# Patient Record
Sex: Male | Born: 1955 | Race: White | Hispanic: No | Marital: Married | Smoking: Current every day smoker
Health system: Southern US, Community
[De-identification: ages and names within clinical notes are randomized; demographics above are authoritative.]

## PROBLEM LIST (undated history)

## (undated) DIAGNOSIS — K7460 Unspecified cirrhosis of liver: Secondary | ICD-10-CM

## (undated) DIAGNOSIS — E119 Type 2 diabetes mellitus without complications: Secondary | ICD-10-CM

## (undated) DIAGNOSIS — M1990 Unspecified osteoarthritis, unspecified site: Secondary | ICD-10-CM

## (undated) HISTORY — DX: Type 2 diabetes mellitus without complications: E11.9

## (undated) HISTORY — DX: Unspecified cirrhosis of liver: K74.60

---

## 2012-09-06 ENCOUNTER — Ambulatory Visit (INDEPENDENT_AMBULATORY_CARE_PROVIDER_SITE_OTHER): Payer: Self-pay | Admitting: Internal Medicine

## 2012-09-06 ENCOUNTER — Other Ambulatory Visit (INDEPENDENT_AMBULATORY_CARE_PROVIDER_SITE_OTHER): Payer: Self-pay | Admitting: *Deleted

## 2012-09-06 ENCOUNTER — Encounter (INDEPENDENT_AMBULATORY_CARE_PROVIDER_SITE_OTHER): Payer: Self-pay | Admitting: *Deleted

## 2012-09-06 ENCOUNTER — Ambulatory Visit (INDEPENDENT_AMBULATORY_CARE_PROVIDER_SITE_OTHER): Payer: 59 | Admitting: Internal Medicine

## 2012-09-06 ENCOUNTER — Encounter (INDEPENDENT_AMBULATORY_CARE_PROVIDER_SITE_OTHER): Payer: Self-pay | Admitting: Internal Medicine

## 2012-09-06 VITALS — BP 126/58 | HR 72 | Temp 97.7°F | Ht 71.0 in | Wt 318.7 lb

## 2012-09-06 DIAGNOSIS — I8500 Esophageal varices without bleeding: Secondary | ICD-10-CM | POA: Insufficient documentation

## 2012-09-06 DIAGNOSIS — K7460 Unspecified cirrhosis of liver: Secondary | ICD-10-CM

## 2012-09-06 DIAGNOSIS — E119 Type 2 diabetes mellitus without complications: Secondary | ICD-10-CM | POA: Insufficient documentation

## 2012-09-06 LAB — PROTIME-INR
INR: 1.25 (ref <1.50)
Prothrombin Time: 15.6 seconds — ABNORMAL HIGH (ref 11.6–15.2)

## 2012-09-06 MED ORDER — NADOLOL 20 MG PO TABS: 20.0000 mg | ORAL_TABLET | Freq: Every day | ORAL | Status: DC

## 2012-09-06 NOTE — Patient Instructions (Signed)
EGD with esophageal banding  

## 2012-09-06 NOTE — Progress Notes (Signed)
Subjective:     Patient ID: David Solomon, male   DOB: 1956/05/13, 57 y.o.   MRN: [id number]  HPIReferred to our office by Adrienne Mocha MD, [facility] in [place] for an esophageal variceal  banding.  Underwent an US abdomen 07/24/2012 which showed portal hypertension and cirrhosis. [place] did not show any evidence of hepatocellular cancer. Small to moderate scattered ascites. Gallbladder wall thickening, likely related to chronic liver disease or ortal hypertension given ascites and splenomegaly. No evidence for large shadowing stones or hydronephrosis.  Spleen is enlarged measuring 20 cm.   Diagnosed in February of this year with cirrhosis. Hep C Virus Ab. 0.2 negative HepBsAG screen negative  Ammonia 59   He noticed the swelling February 1 of this year. He had swelling to his scrotum, abdomen, and lower extremities. He denies prior hx of edema.  Placed on Lasix 80mg  and Aldactone. His weight on his 1st visit in February (10th)  was 380. He is down to 318 now. He says his weight is at base line now.  He tells me he feels better now. He is voiding okay.  Appetite is good. Liquids are restricted to a liter a day. Low sodium diet. He usually has a BM daily  Total protein 7.0 albumin 3.1 ALP 96, ALT 40, AST 37, Total bili 1.2, Direct bili 0.3, Indirect bili 0.9 08/03/2012 NA 140, K 3.7, Chloride 100, Glucose 199, BUN 22, Creatinine 0.6,  H and H 39 and 13.3.  BNP 56, Albumin 2.8,  HA1C: 8.5  Hx of morbid obesity and DM2.    Review of Systems see hpi Current Outpatient Prescriptions  Medication Sig Dispense Refill  . furosemide (LASIX) 40 MG tablet Take 40 mg by mouth daily.      Marland Kitchen glipiZIDE (GLUCOTROL) 5 MG tablet Take 5 mg by mouth 2 (two) times daily before a meal.      . metFORMIN (GLUCOPHAGE) 1000 MG tablet Take 1,000 mg by mouth 2 (two) times daily with a meal.      . spironolactone (ALDACTONE) 25 MG tablet Take 25 mg by mouth daily.       No current  facility-administered medications for this visit.   Past Medical History  Diagnosis Date  . Cirrhosis of liver not due to alcohol   . Diabetes     type 2 x 15 yrs.   History reviewed. No pertinent past surgical history. No Known Allergies       Objective:   Physical Exam  Filed Vitals:   09/06/12 1538  BP: 126/58  Pulse: 72  Temp: 97.7 F (36.5 C)  Height: 5\' 11"  (1.803 m)  Weight: 318 lb 11.2 oz (144.561 kg)   Alert and oriented. Skin warm and dry. Oral mucosa is moist.   . Sclera anicteric, conjunctivae is pink. Thyroid not enlarged. No cervical lymphadenopathy. Lungs clear. Heart regular rate and rhythm.  Abdomen is soft. Bowel sounds are positive. Unable to palpate liver.  No abdominal masses felt.  Morbidly obese. No tenderness.   . Morbidly obese  No asterixis    Assessment:    Esophageal varices per EGD by Dr. Kirby Crigler. I discussed this case with Dr. Karilyn Cota and Dr. Karilyn Cota also in exam room.     Plan:    EGD with esophageal banding. CBC, PT/INR before procedure.

## 2012-09-07 ENCOUNTER — Encounter (HOSPITAL_COMMUNITY): Payer: Self-pay | Admitting: Pharmacy Technician

## 2012-09-07 LAB — CBC WITH DIFFERENTIAL/PLATELET
Basophils Relative: 1 % (ref 0–1)
HCT: 40.1 % (ref 39.0–52.0)
Hemoglobin: 13.1 g/dL (ref 13.0–17.0)
Lymphocytes Relative: 21 % (ref 12–46)
Lymphs Abs: 0.8 10*3/uL (ref 0.7–4.0)
MCHC: 32.7 g/dL (ref 30.0–36.0)
Monocytes Absolute: 0.5 10*3/uL (ref 0.1–1.0)
Monocytes Relative: 14 % — ABNORMAL HIGH (ref 3–12)
Neutro Abs: 2.3 10*3/uL (ref 1.7–7.7)
Neutrophils Relative %: 60 % (ref 43–77)
RBC: 4.78 MIL/uL (ref 4.22–5.81)

## 2012-09-08 ENCOUNTER — Encounter (HOSPITAL_COMMUNITY)
Admission: RE | Disposition: A | Discharge status: Home / Self Care | Payer: Self-pay | Source: Ambulatory Visit | Attending: Internal Medicine

## 2012-09-08 ENCOUNTER — Encounter (HOSPITAL_COMMUNITY): Payer: Self-pay | Admitting: *Deleted

## 2012-09-08 ENCOUNTER — Ambulatory Visit (HOSPITAL_COMMUNITY)
Admission: RE | Admit: 2012-09-08 | Discharge: 2012-09-08 | Disposition: A | Discharge status: Home / Self Care | Payer: 59 | Source: Ambulatory Visit | Attending: Internal Medicine | Admitting: Internal Medicine

## 2012-09-08 DIAGNOSIS — I8500 Esophageal varices without bleeding: Secondary | ICD-10-CM

## 2012-09-08 DIAGNOSIS — I8510 Secondary esophageal varices without bleeding: Secondary | ICD-10-CM | POA: Insufficient documentation

## 2012-09-08 DIAGNOSIS — Z01812 Encounter for preprocedural laboratory examination: Secondary | ICD-10-CM | POA: Insufficient documentation

## 2012-09-08 DIAGNOSIS — E119 Type 2 diabetes mellitus without complications: Secondary | ICD-10-CM | POA: Insufficient documentation

## 2012-09-08 DIAGNOSIS — K7460 Unspecified cirrhosis of liver: Secondary | ICD-10-CM | POA: Insufficient documentation

## 2012-09-08 HISTORY — PX: ESOPHAGOGASTRODUODENOSCOPY: SHX5428

## 2012-09-08 HISTORY — PX: ESOPHAGEAL BANDING: SHX5518

## 2012-09-08 HISTORY — DX: Unspecified osteoarthritis, unspecified site: M19.90

## 2012-09-08 SURGERY — EGD (ESOPHAGOGASTRODUODENOSCOPY)
Anesthesia: Moderate Sedation

## 2012-09-08 MED ORDER — MEPERIDINE HCL 25 MG/ML IJ SOLN
INTRAMUSCULAR | Status: DC | PRN
  Administered 2012-09-08 (×2): 25 mg via INTRAVENOUS

## 2012-09-08 MED ORDER — PANTOPRAZOLE SODIUM 40 MG PO TBEC: 40.0000 mg | DELAYED_RELEASE_TABLET | Freq: Every day | ORAL | Status: DC

## 2012-09-08 MED ORDER — MIDAZOLAM HCL 5 MG/5ML IJ SOLN
INTRAMUSCULAR | Status: DC | PRN
  Administered 2012-09-08 (×4): 2 mg via INTRAVENOUS

## 2012-09-08 MED ORDER — STERILE WATER FOR IRRIGATION IR SOLN
Status: DC | PRN
  Administered 2012-09-08: 16:00:00

## 2012-09-08 MED ORDER — MEPERIDINE HCL 50 MG/ML IJ SOLN
INTRAMUSCULAR | Status: AC
  Filled 2012-09-08: qty 2

## 2012-09-08 MED ORDER — MIDAZOLAM HCL 5 MG/5ML IJ SOLN
INTRAMUSCULAR | Status: AC
  Filled 2012-09-08: qty 10

## 2012-09-08 MED ORDER — BUTAMBEN-TETRACAINE-BENZOCAINE 2-2-14 % EX AERO
INHALATION_SPRAY | CUTANEOUS | Status: DC | PRN
  Administered 2012-09-08: 2 via TOPICAL

## 2012-09-08 MED ORDER — SUCRALFATE 1 G PO TABS
1.0000 g | ORAL_TABLET | Freq: Four times a day (QID) | ORAL | Status: DC
Start: 2012-09-08 — End: 2021-09-21

## 2012-09-08 MED ORDER — SODIUM CHLORIDE 0.9 % IV SOLN
INTRAVENOUS | Status: DC
  Administered 2012-09-08: 1000 mL via INTRAVENOUS

## 2012-09-08 NOTE — H&P (Signed)
David Solomon is an 57 y.o. male.   Chief Complaint: Patient sent for EGD and esophageal variceal banding. 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 He was evaluated by Dr. Despina Hidden and underwent EGD and  found to have large esophageal varices. He is therefore sent over for esophageal variceal banding for primary prophylaxis. Patient was begun on Medrol 2 days ago and is tolerating it well.  Past Medical History  Diagnosis Date  . Cirrhosis of liver not due to alcohol   . Diabetes     type 2 x 15 yrs.  . Arthritis     History reviewed. No pertinent past surgical history.  History reviewed. No pertinent family history. Social History:  reports that he has been smoking Cigarettes.  He has been smoking about 0.00 packs per day. He does not have any smokeless tobacco history on file. He reports that he does not drink alcohol or use illicit drugs.  Allergies: No Known Allergies  Medications Prior to Admission  Medication Sig Dispense Refill  . furosemide (LASIX) 40 MG tablet Take 40 mg by mouth daily.      Marland Kitchen glipiZIDE (GLUCOTROL) 5 MG tablet Take 5 mg by mouth 2 (two) times daily before a meal.      . metFORMIN (GLUCOPHAGE) 1000 MG tablet Take 1,000 mg by mouth 2 (two) times daily with a meal.      . nadolol (CORGARD) 20 MG tablet Take 1 tablet (20 mg total) by mouth daily.  30 tablet  2  . spironolactone (ALDACTONE) 25 MG tablet Take 25 mg by mouth daily.        Results for orders placed during the hospital encounter of 09/08/12 (from the past 48 hour(s))  GLUCOSE, CAPILLARY     Status: Abnormal   Collection Time    09/08/12  3:18 PM      Result Value Range   Glucose-Capillary 150 (*) 70 - 99 mg/dL   No results found.  ROS  Blood pressure 114/81, pulse 62, temperature 97.4 F (36.3 C), temperature source Oral, resp. rate 18, height 5\' 11"  (1.803 m), weight 318 lb (144.244 kg), SpO2  99.00%. Physical Exam  Constitutional:  Pleasant well developed obese Caucasian male in NAD  HENT:  Mouth/Throat: Oropharynx is clear and moist.  Eyes: Conjunctivae are normal. No scleral icterus.  Neck: No thyromegaly present.  Cardiovascular: Normal rate, regular rhythm and normal heart sounds.   No murmur heard. Respiratory: Effort normal and breath sounds normal.  GI:  Abdomen is obese but soft and nontender without hepatosplenomegaly. It is difficult to palpate these organs given body habitus.  Musculoskeletal: Edema: 1+ pitting edema both legs.  Lymphadenopathy:    He has no cervical adenopathy.  Neurological: He is alert.  Skin: Skin is warm and dry.     Assessment/Plan Cirrhosis secondary to NAFLD complicated by large esophageal varices. EGD with esophageal variceal banding for primary prophylaxis.  David Solomon U 09/08/2012, 3:43 PM

## 2012-09-08 NOTE — Op Note (Signed)
EGD WITH ESOPHAGEAL VARICEAL BANDING PROCEDURE REPORT  PATIENT:  David Solomon  MR#:  [id number] Birthdate:  Sep 25, 1955, 57 y.o., male Endoscopist:  Dr. Malissa Hippo, MD Referred By:  Dr. Mike Gip, MD Procedure Date: 09/08/2012  Procedure:   EGD with esophageal variceal banding.  Indications:  Patient is 57 year old Caucasian male with cirrhosis secondary to NAFLD who was found to have large esophageal varices by his primary gastroenterologist Dr.Shiflet and sent over for esophageal variceal banding.             Informed Consent:  The risks, benefits, alternatives & imponderables which include, but are not limited to, bleeding, infection, perforation, drug reaction and potential missed lesion have been reviewed.  The potential for biopsy, lesion removal, esophageal dilation, etc. have also been discussed.  Questions have been answered.  All parties agreeable.  Please see history & physical in medical record for more information.  Medications:  Demerol 50 mg IV Versed 8 mg IV Cetacaine spray topically for oropharyngeal anesthesia  Description of procedure:  The endoscope was introduced through the mouth and advanced to the second portion of the duodenum without difficulty or limitations. The mucosal surfaces were surveyed very carefully during advancement of the scope and upon withdrawal.  Findings:  Esophagus:  Mucosa of the proximal esophagus normal. 2 columns of varices is noted midesophagus in 4 columns distally. two were large and grade 4 and the two other werwe smaller. GEJ:  41 cm Stomach:  Stomach was empty and distended very well insufflation. Folds in the proximal stomach were normal. Examination mucosa revealed snakeskin and normocytic pattern with a few punctate erythematous areas at antrum. Pyloric channel was patent. Annularis fundus and cardia were examined by retroflexing the scope and no abnormalities noted. Duodenum:  Normal bulbar and post bulbar  mucosa.  Therapeutic/Diagnostic Maneuvers Performed:   And this goes withdrawn and multiple banding device was loaded onto the scope which was then reintroduced into distal esophagus. All 4 columns were banded in 2 bands are applied to the way except 6 o'clock. Endoscope withdrawn. Patient tolerated the procedure without  Complications:  None  Impression: 4 columns of esophageal varices. Two of these columns of grade 4 and others were smaller. All 4 columns were banded. Portal gastropathy.  Recommendations:  Pantoprazole 40 mg by mouth every morning. Sucralfate 1 g a.c. and each bedtime for 2 weeks. Full liquids for 24 hours and then usual diet. Will bring him back for repeat banding session in 5-6 weeks.  Saidy Ormand U  09/08/2012  4:17 PM  CC: Dr. Aniceto Boss., MD & Dr. Bonnetta Barry ref. provider found CC vDr. Mike Gip MD

## 2012-09-12 ENCOUNTER — Encounter (HOSPITAL_COMMUNITY): Payer: Self-pay | Admitting: Internal Medicine

## 2012-09-18 ENCOUNTER — Encounter (INDEPENDENT_AMBULATORY_CARE_PROVIDER_SITE_OTHER): Payer: Self-pay

## 2012-10-09 ENCOUNTER — Encounter (INDEPENDENT_AMBULATORY_CARE_PROVIDER_SITE_OTHER): Payer: Self-pay | Admitting: *Deleted

## 2012-12-08 DIAGNOSIS — K760 Fatty (change of) liver, not elsewhere classified: Secondary | ICD-10-CM | POA: Insufficient documentation

## 2015-02-20 DIAGNOSIS — G4733 Obstructive sleep apnea (adult) (pediatric): Secondary | ICD-10-CM | POA: Insufficient documentation

## 2015-04-11 DIAGNOSIS — K7460 Unspecified cirrhosis of liver: Secondary | ICD-10-CM | POA: Insufficient documentation

## 2015-07-23 ENCOUNTER — Encounter (INDEPENDENT_AMBULATORY_CARE_PROVIDER_SITE_OTHER): Payer: Medicare Other | Admitting: Ophthalmology

## 2015-07-23 DIAGNOSIS — E11311 Type 2 diabetes mellitus with unspecified diabetic retinopathy with macular edema: Secondary | ICD-10-CM

## 2015-07-23 DIAGNOSIS — H2513 Age-related nuclear cataract, bilateral: Secondary | ICD-10-CM | POA: Diagnosis not present

## 2015-07-23 DIAGNOSIS — H43813 Vitreous degeneration, bilateral: Secondary | ICD-10-CM | POA: Diagnosis not present

## 2015-07-23 DIAGNOSIS — E113313 Type 2 diabetes mellitus with moderate nonproliferative diabetic retinopathy with macular edema, bilateral: Secondary | ICD-10-CM | POA: Diagnosis not present

## 2015-08-13 ENCOUNTER — Other Ambulatory Visit (INDEPENDENT_AMBULATORY_CARE_PROVIDER_SITE_OTHER): Payer: Medicare Other | Admitting: Ophthalmology

## 2015-08-13 DIAGNOSIS — E113312 Type 2 diabetes mellitus with moderate nonproliferative diabetic retinopathy with macular edema, left eye: Secondary | ICD-10-CM

## 2015-08-13 DIAGNOSIS — E11311 Type 2 diabetes mellitus with unspecified diabetic retinopathy with macular edema: Secondary | ICD-10-CM

## 2015-08-20 ENCOUNTER — Encounter (INDEPENDENT_AMBULATORY_CARE_PROVIDER_SITE_OTHER): Payer: Medicare Other | Admitting: Ophthalmology

## 2015-08-20 DIAGNOSIS — E11311 Type 2 diabetes mellitus with unspecified diabetic retinopathy with macular edema: Secondary | ICD-10-CM | POA: Diagnosis not present

## 2015-08-20 DIAGNOSIS — E113313 Type 2 diabetes mellitus with moderate nonproliferative diabetic retinopathy with macular edema, bilateral: Secondary | ICD-10-CM

## 2015-08-20 DIAGNOSIS — H43813 Vitreous degeneration, bilateral: Secondary | ICD-10-CM

## 2015-08-20 DIAGNOSIS — H2513 Age-related nuclear cataract, bilateral: Secondary | ICD-10-CM

## 2015-09-17 ENCOUNTER — Encounter (INDEPENDENT_AMBULATORY_CARE_PROVIDER_SITE_OTHER): Payer: Medicare Other | Admitting: Ophthalmology

## 2015-09-17 DIAGNOSIS — E11311 Type 2 diabetes mellitus with unspecified diabetic retinopathy with macular edema: Secondary | ICD-10-CM

## 2015-09-17 DIAGNOSIS — E113313 Type 2 diabetes mellitus with moderate nonproliferative diabetic retinopathy with macular edema, bilateral: Secondary | ICD-10-CM

## 2015-09-17 DIAGNOSIS — H43813 Vitreous degeneration, bilateral: Secondary | ICD-10-CM | POA: Diagnosis not present

## 2015-09-17 DIAGNOSIS — H2513 Age-related nuclear cataract, bilateral: Secondary | ICD-10-CM

## 2015-10-22 ENCOUNTER — Encounter (INDEPENDENT_AMBULATORY_CARE_PROVIDER_SITE_OTHER): Payer: Medicare Other | Admitting: Ophthalmology

## 2015-10-22 DIAGNOSIS — H43813 Vitreous degeneration, bilateral: Secondary | ICD-10-CM | POA: Diagnosis not present

## 2015-10-22 DIAGNOSIS — E11311 Type 2 diabetes mellitus with unspecified diabetic retinopathy with macular edema: Secondary | ICD-10-CM | POA: Diagnosis not present

## 2015-10-22 DIAGNOSIS — E113313 Type 2 diabetes mellitus with moderate nonproliferative diabetic retinopathy with macular edema, bilateral: Secondary | ICD-10-CM

## 2015-10-22 DIAGNOSIS — H2513 Age-related nuclear cataract, bilateral: Secondary | ICD-10-CM

## 2015-11-05 ENCOUNTER — Other Ambulatory Visit (INDEPENDENT_AMBULATORY_CARE_PROVIDER_SITE_OTHER): Payer: Medicare Other | Admitting: Ophthalmology

## 2015-11-05 DIAGNOSIS — E11311 Type 2 diabetes mellitus with unspecified diabetic retinopathy with macular edema: Secondary | ICD-10-CM | POA: Diagnosis not present

## 2015-11-05 DIAGNOSIS — E113311 Type 2 diabetes mellitus with moderate nonproliferative diabetic retinopathy with macular edema, right eye: Secondary | ICD-10-CM

## 2015-11-26 ENCOUNTER — Encounter (INDEPENDENT_AMBULATORY_CARE_PROVIDER_SITE_OTHER): Payer: Medicare Other | Admitting: Ophthalmology

## 2015-11-26 DIAGNOSIS — H43813 Vitreous degeneration, bilateral: Secondary | ICD-10-CM

## 2015-11-26 DIAGNOSIS — E113313 Type 2 diabetes mellitus with moderate nonproliferative diabetic retinopathy with macular edema, bilateral: Secondary | ICD-10-CM

## 2015-11-26 DIAGNOSIS — E11311 Type 2 diabetes mellitus with unspecified diabetic retinopathy with macular edema: Secondary | ICD-10-CM | POA: Diagnosis not present

## 2015-11-26 DIAGNOSIS — H2513 Age-related nuclear cataract, bilateral: Secondary | ICD-10-CM | POA: Diagnosis not present

## 2015-12-31 ENCOUNTER — Encounter (INDEPENDENT_AMBULATORY_CARE_PROVIDER_SITE_OTHER): Payer: Medicare Other | Admitting: Ophthalmology

## 2016-01-07 ENCOUNTER — Encounter (INDEPENDENT_AMBULATORY_CARE_PROVIDER_SITE_OTHER): Payer: Medicare Other | Admitting: Ophthalmology

## 2016-01-07 DIAGNOSIS — H43813 Vitreous degeneration, bilateral: Secondary | ICD-10-CM

## 2016-01-07 DIAGNOSIS — E11311 Type 2 diabetes mellitus with unspecified diabetic retinopathy with macular edema: Secondary | ICD-10-CM

## 2016-01-07 DIAGNOSIS — E113313 Type 2 diabetes mellitus with moderate nonproliferative diabetic retinopathy with macular edema, bilateral: Secondary | ICD-10-CM | POA: Diagnosis not present

## 2016-02-25 ENCOUNTER — Encounter (INDEPENDENT_AMBULATORY_CARE_PROVIDER_SITE_OTHER): Payer: Medicare Other | Admitting: Ophthalmology

## 2016-02-25 DIAGNOSIS — E11311 Type 2 diabetes mellitus with unspecified diabetic retinopathy with macular edema: Secondary | ICD-10-CM | POA: Diagnosis not present

## 2016-02-25 DIAGNOSIS — H43813 Vitreous degeneration, bilateral: Secondary | ICD-10-CM | POA: Diagnosis not present

## 2016-02-25 DIAGNOSIS — H2513 Age-related nuclear cataract, bilateral: Secondary | ICD-10-CM | POA: Diagnosis not present

## 2016-02-25 DIAGNOSIS — E113313 Type 2 diabetes mellitus with moderate nonproliferative diabetic retinopathy with macular edema, bilateral: Secondary | ICD-10-CM | POA: Diagnosis not present

## 2016-04-19 DIAGNOSIS — D696 Thrombocytopenia, unspecified: Secondary | ICD-10-CM | POA: Insufficient documentation

## 2016-04-21 ENCOUNTER — Encounter (INDEPENDENT_AMBULATORY_CARE_PROVIDER_SITE_OTHER): Payer: Medicare Other | Admitting: Ophthalmology

## 2016-04-21 DIAGNOSIS — H2513 Age-related nuclear cataract, bilateral: Secondary | ICD-10-CM

## 2016-04-21 DIAGNOSIS — E11311 Type 2 diabetes mellitus with unspecified diabetic retinopathy with macular edema: Secondary | ICD-10-CM | POA: Diagnosis not present

## 2016-04-21 DIAGNOSIS — E113313 Type 2 diabetes mellitus with moderate nonproliferative diabetic retinopathy with macular edema, bilateral: Secondary | ICD-10-CM | POA: Diagnosis not present

## 2016-04-21 DIAGNOSIS — H43813 Vitreous degeneration, bilateral: Secondary | ICD-10-CM

## 2016-06-30 ENCOUNTER — Encounter (INDEPENDENT_AMBULATORY_CARE_PROVIDER_SITE_OTHER): Payer: Medicare Other | Admitting: Ophthalmology

## 2016-07-19 ENCOUNTER — Encounter (INDEPENDENT_AMBULATORY_CARE_PROVIDER_SITE_OTHER): Payer: Medicare Other | Admitting: Ophthalmology

## 2016-09-06 ENCOUNTER — Encounter (INDEPENDENT_AMBULATORY_CARE_PROVIDER_SITE_OTHER): Payer: Medicare Other | Admitting: Ophthalmology

## 2016-10-04 ENCOUNTER — Encounter (INDEPENDENT_AMBULATORY_CARE_PROVIDER_SITE_OTHER): Payer: Medicare Other | Admitting: Ophthalmology

## 2016-10-04 DIAGNOSIS — H2513 Age-related nuclear cataract, bilateral: Secondary | ICD-10-CM | POA: Diagnosis not present

## 2016-10-04 DIAGNOSIS — E113313 Type 2 diabetes mellitus with moderate nonproliferative diabetic retinopathy with macular edema, bilateral: Secondary | ICD-10-CM | POA: Diagnosis not present

## 2016-10-04 DIAGNOSIS — H43813 Vitreous degeneration, bilateral: Secondary | ICD-10-CM

## 2016-10-04 DIAGNOSIS — E11311 Type 2 diabetes mellitus with unspecified diabetic retinopathy with macular edema: Secondary | ICD-10-CM | POA: Diagnosis not present

## 2016-12-27 ENCOUNTER — Encounter (INDEPENDENT_AMBULATORY_CARE_PROVIDER_SITE_OTHER): Payer: Medicare Other | Admitting: Ophthalmology

## 2017-02-16 DIAGNOSIS — I6381 Other cerebral infarction due to occlusion or stenosis of small artery: Secondary | ICD-10-CM | POA: Insufficient documentation

## 2017-02-23 ENCOUNTER — Encounter (INDEPENDENT_AMBULATORY_CARE_PROVIDER_SITE_OTHER): Payer: Medicare Other | Admitting: Ophthalmology

## 2017-10-03 ENCOUNTER — Ambulatory Visit (INDEPENDENT_AMBULATORY_CARE_PROVIDER_SITE_OTHER): Payer: Medicare Other | Admitting: Ophthalmology

## 2019-10-01 DIAGNOSIS — E785 Hyperlipidemia, unspecified: Secondary | ICD-10-CM | POA: Insufficient documentation

## 2019-10-01 DIAGNOSIS — R188 Other ascites: Secondary | ICD-10-CM | POA: Insufficient documentation

## 2019-10-01 DIAGNOSIS — N183 Chronic kidney disease, stage 3 unspecified: Secondary | ICD-10-CM | POA: Insufficient documentation

## 2019-10-31 DIAGNOSIS — Z7189 Other specified counseling: Secondary | ICD-10-CM | POA: Insufficient documentation

## 2020-01-21 DIAGNOSIS — K635 Polyp of colon: Secondary | ICD-10-CM | POA: Insufficient documentation

## 2020-04-04 DIAGNOSIS — K649 Unspecified hemorrhoids: Secondary | ICD-10-CM | POA: Insufficient documentation

## 2021-09-21 ENCOUNTER — Encounter: Payer: Self-pay | Admitting: Podiatry

## 2021-09-21 ENCOUNTER — Other Ambulatory Visit: Payer: Self-pay | Admitting: Podiatry

## 2021-09-21 ENCOUNTER — Telehealth: Payer: Self-pay | Admitting: Podiatry

## 2021-09-21 ENCOUNTER — Ambulatory Visit (INDEPENDENT_AMBULATORY_CARE_PROVIDER_SITE_OTHER): Payer: Medicare HMO

## 2021-09-21 ENCOUNTER — Ambulatory Visit: Payer: Medicare HMO | Admitting: Podiatry

## 2021-09-21 DIAGNOSIS — R262 Difficulty in walking, not elsewhere classified: Secondary | ICD-10-CM | POA: Insufficient documentation

## 2021-09-21 DIAGNOSIS — M7989 Other specified soft tissue disorders: Secondary | ICD-10-CM | POA: Diagnosis not present

## 2021-09-21 DIAGNOSIS — M25512 Pain in left shoulder: Secondary | ICD-10-CM | POA: Insufficient documentation

## 2021-09-21 DIAGNOSIS — D649 Anemia, unspecified: Secondary | ICD-10-CM | POA: Insufficient documentation

## 2021-09-21 DIAGNOSIS — M778 Other enthesopathies, not elsewhere classified: Secondary | ICD-10-CM

## 2021-09-21 DIAGNOSIS — E11610 Type 2 diabetes mellitus with diabetic neuropathic arthropathy: Secondary | ICD-10-CM

## 2021-09-21 DIAGNOSIS — W19XXXA Unspecified fall, initial encounter: Secondary | ICD-10-CM | POA: Insufficient documentation

## 2021-09-21 DIAGNOSIS — J9611 Chronic respiratory failure with hypoxia: Secondary | ICD-10-CM | POA: Insufficient documentation

## 2021-09-21 DIAGNOSIS — K7682 Hepatic encephalopathy: Secondary | ICD-10-CM | POA: Insufficient documentation

## 2021-09-21 MED ORDER — ALLOPURINOL 100 MG PO TABS: 200.0000 mg | ORAL_TABLET | Freq: Every day | ORAL | 6 refills | Status: DC

## 2021-09-21 MED ORDER — COLCHICINE 0.6 MG PO TABS: ORAL_TABLET | ORAL | 0 refills | Status: DC

## 2021-09-21 MED ORDER — TRIAMCINOLONE ACETONIDE 40 MG/ML IJ SUSP: 40.0000 mg | Freq: Once | INTRAMUSCULAR | Status: DC

## 2021-09-21 NOTE — Telephone Encounter (Signed)
I put in a request to expedite insurance authorization to [facility] and they will work on it as soon as possible and contact patient for scheduling. ?

## 2021-09-21 NOTE — Progress Notes (Signed)
?Subjective:  ?Patient ID: David Solomon, male    DOB: March 27, 1956,  MRN: [id number] ?HPI ?Chief Complaint  ?Patient presents with  ? Foot Pain  ?  Dorsal foot right - aching x 1 month, seen other docs-had ultrasound (negative DVT), prednisone - didn't help, elevated sugars, foot and leg very swollen, no injury, he is in prelim testing for liver and kidney transplant and has to do a 6 min walk for testing on May 9th  ? Diabetes  ?  Last a1c was 6.5  ? ? ?66 y.o. male presents with the above complaint.  ? ?ROS: Denies fever chills nausea vomiting muscle aches pains calf pain on the left side positive for calf pain on the right side denies back pain chest pain shortness of breath. ? ?States that he had increase his activity and started walking for about 2 days before he noticed of swelling in his foot and leg. ? ?States that he has been seen by hospital system in [place] who did a ultrasound on his calf and thigh and was negative for DVT. ? ?Past Medical History:  ?Diagnosis Date  ? Arthritis   ? Cirrhosis of liver not due to alcohol ([place])   ? Diabetes ([place])   ? type 2 x 15 yrs.  ? ?Past Surgical History:  ?Procedure Laterality Date  ? ESOPHAGEAL BANDING N/A 09/08/2012  ? Procedure: ESOPHAGEAL BANDING;  Surgeon: Rogene Houston, MD;  [facility]: [facility];  Service: Endoscopy;  Laterality: N/A;  ? ESOPHAGOGASTRODUODENOSCOPY N/A 09/08/2012  ? Procedure: ESOPHAGOGASTRODUODENOSCOPY (EGD);  Surgeon: Rogene Houston, MD;  Location: [facility];  Service: Endoscopy;  Laterality: N/A;  320-moved to [place] notified pt  ? ? ?Current Outpatient Medications:  ?  Insulin Pen Needle (B-D ULTRAFINE III SHORT PEN) 31G X 8 MM MISC, 1 Application in the morning and 1 Application at noon and 1 Application in the evening and 1 Application before bedtime by Does not apply route., Disp: , Rfl:  ?  fexofenadine (ALLEGRA) 180 MG tablet, Take 180 mg by mouth every morning., Disp: , Rfl:  ?  furosemide (LASIX) 40 MG tablet, Take 40 mg by  mouth daily., Disp: , Rfl:  ?  gabapentin (NEURONTIN) 300 MG capsule, Take 300 mg by mouth at bedtime., Disp: , Rfl:  ?  lactulose (CHRONULAC) 10 GM/15ML solution, SMARTSIG:45 Milliliter(s) By Mouth Daily, Disp: , Rfl:  ?  NOVOLOG FLEXPEN 100 UNIT/ML FlexPen, PLEASE SEE ATTACHED FOR DETAILED DIRECTIONS, Disp: , Rfl:  ?  propranolol (INDERAL) 20 MG tablet, Take 10 mg by mouth 2 (two) times daily., Disp: , Rfl:  ?  rosuvastatin (CRESTOR) 10 MG tablet, Take 10 mg by mouth daily., Disp: , Rfl:  ?  spironolactone (ALDACTONE) 25 MG tablet, Take 25 mg by mouth daily., Disp: , Rfl:  ?  TRESIBA FLEXTOUCH 200 UNIT/ML FlexTouch Pen, SMARTSIG:50 Unit(s) SUB-Q Daily, Disp: , Rfl:  ?  Vitamin D, Ergocalciferol, (DRISDOL) 1.25 MG (50000 UNIT) CAPS capsule, Take 50,000 Units by mouth once a week., Disp: , Rfl:  ?  XIFAXAN 550 MG TABS tablet, Take 550 mg by mouth 2 (two) times daily., Disp: , Rfl:  ? ?Allergies  ?Allergen Reactions  ? Ceftriaxone Anaphylaxis  ?  Other reaction(s): Other (See Comments) ?Confusion  ?  ? Dobutamine Nausea Only  ?  Severe nausea with stress test on 08/10/2021 for which test had to be terminated   ? ?Review of Systems ?Objective:  ?There were no vitals filed for this  visit. ? ?General: Well developed, nourished, in no acute distress, alert and oriented x3  ? ?Dermatological: Skin is warm, dry and supple bilateral. Nails x 10 are well maintained; remaining integument appears unremarkable at this time. There are no open sores, no preulcerative lesions, no rash or signs of infection present. ? ?Vascular: Dorsalis Pedis artery and Posterior Tibial artery pedal pulses are 2/4 bilateral with immedate capillary fill time. Pedal hair growth present. No varicosities and no lower extremity edema present bilateral.  Severe edema to the right lower extremity with pain on medial-lateral compression of the gastroc and posterior calf.  It is warm to the touch but has recently had a negative DVT on  Doppler. ? ?Neruologic: Grossly intact via light touch bilateral. Vibratory intact via tuning fork bilateral. Protective threshold with Semmes Wienstein monofilament intact to all pedal sites bilateral. Patellar and Achilles deep tendon reflexes 2+ bilateral. No Babinski or clonus noted bilateral.  ? ?Musculoskeletal: No gross boney pedal deformities bilateral. No pain, crepitus, or limitation noted with foot and ankle range of motion bilateral. Muscular strength 5/5 in all groups tested bilateral.  Pain and discomfort on palpation of the dorsal aspect of the foot and range of motion of the ankle.  He has pain on palpation of the dorsal foot at the navicular cuneiform area. ? ?Gait: Unassisted, Nonantalgic.  ? ? ?Radiographs: ? ?Radiographs taken today demonstrate severe edema to the right lower extremity with what appears to be a fracture and extrusion or possible total extrusion of the intermediate cuneiform dorsally.  No fractures are identified other than this one he does demonstrate some calcified arteries and some cutis calcification and phleboliths. ? ?Assessment & Plan:  ? ?Assessment: Probable extrusion of the intermediate cuneiform or fracture of the intermediate cuneiform right foot however cannot rule out a Charcot arthropathy..  I really question the fact that he has no DVT with such pain in his gastroc. ? ?Plan: At this point and requesting an MRI to evaluate the foot for osseous extrusion or Charcot arthropathy which would be more likely with the ascending edema erythema and tenderness. ? ?Placed him in a Unna boot and a Darco shoe once the MRI is back we will notify him as to the results.  He will most likely be following up with his transplant coordinator at [facility] for this as well. ? ? ? ? ?Maida Widger T. [place], DPM ?

## 2021-09-21 NOTE — Telephone Encounter (Signed)
Patient sister called she wants to have MRI  done  either at [place] or [place] or [place] whoever can get him in first .

## 2021-09-29 ENCOUNTER — Ambulatory Visit
Admission: RE | Admit: 2021-09-29 | Discharge: 2021-09-29 | Disposition: A | Discharge status: Home / Self Care | Payer: Medicare HMO | Source: Ambulatory Visit | Attending: Podiatry | Admitting: Podiatry

## 2021-09-29 DIAGNOSIS — E11610 Type 2 diabetes mellitus with diabetic neuropathic arthropathy: Secondary | ICD-10-CM

## 2021-09-29 IMAGING — MR MR FOOT*R* W/O CM
4 of 6 series · 14 of 40 positions shown · non-contrast
Comparison: Right foot radiographs [DATE].

CLINICAL DATA: Foot trauma. Tendon injury or dislocation suspected.
Evaluate Charcot arthropathy. Diabetes.

EXAM:
MRI OF THE RIGHT FOREFOOT WITHOUT CONTRAST
TECHNIQUE: Multiplanar, multisequence MR imaging of the right forefoot was
performed. No intravenous contrast was administered.

[Series 4: T1 · coronal · 3.0mm · 0.19mm/px · 3 of 45 slices shown (1 of 2)]
[im 6/45]
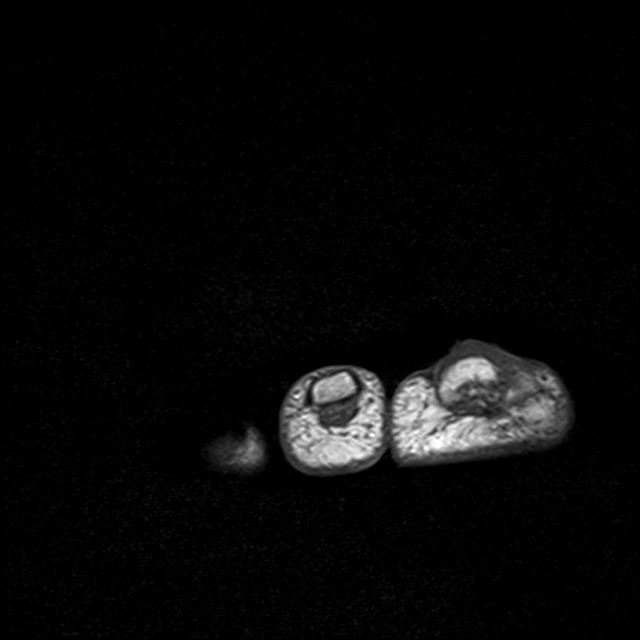
[im 23/45]
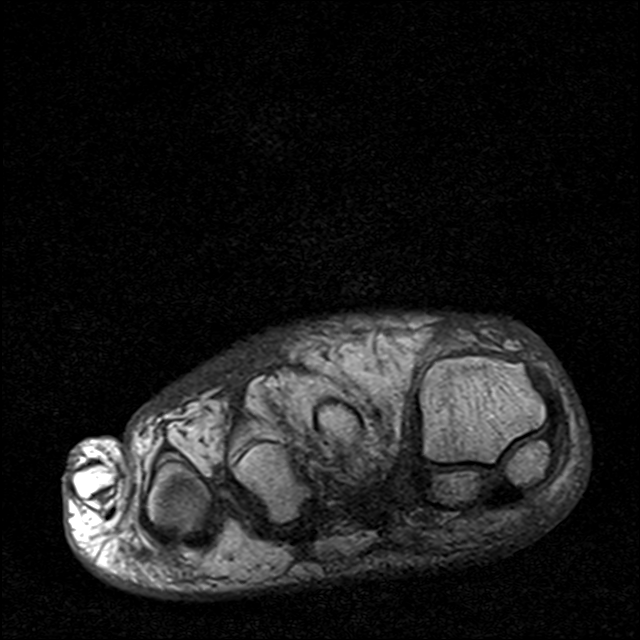
[im 39/45]
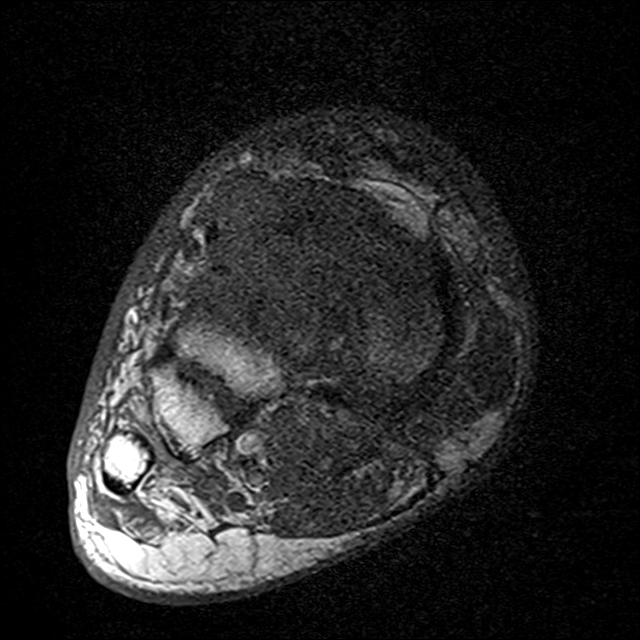

[Series 5: T2 fat-sat · coronal · 3.0mm · 0.19mm/px · 5 of 45 slices shown (1 of 2)]
[im 1/45]
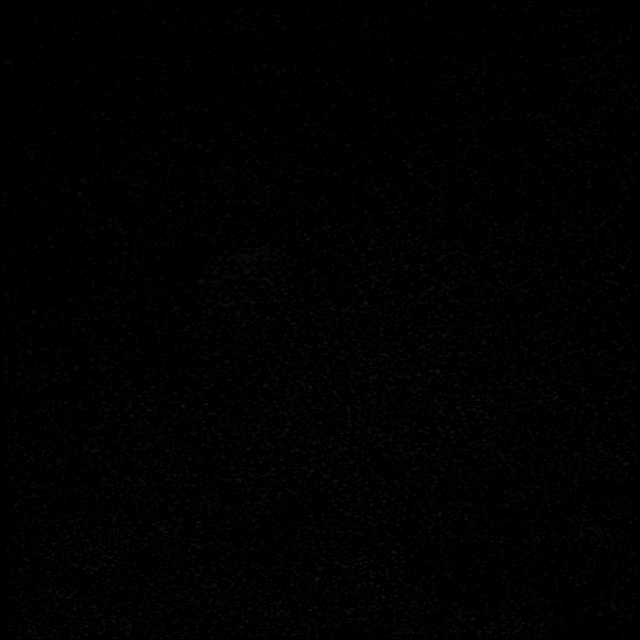
[im 6/45]
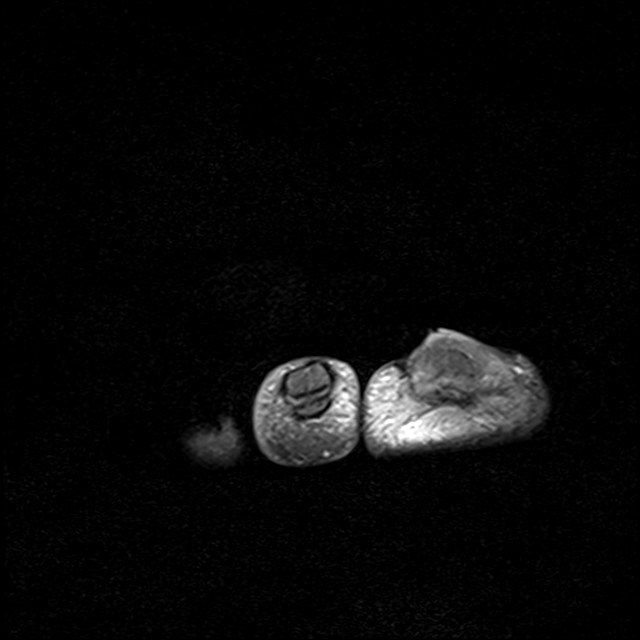
[im 12/45]
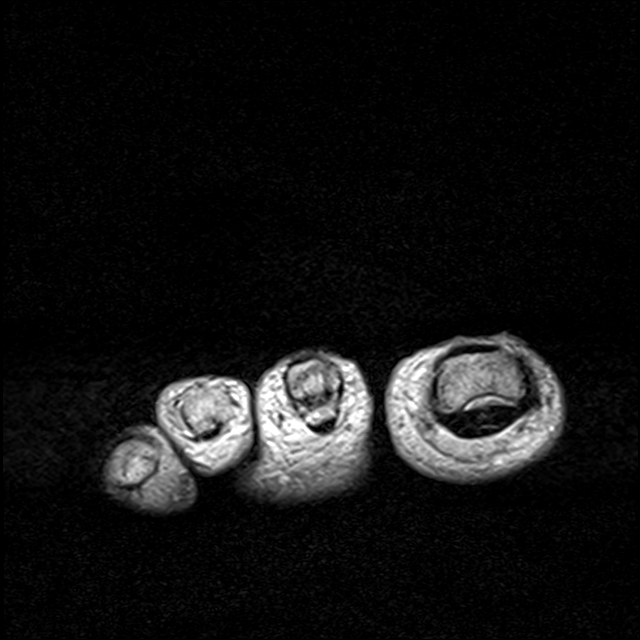
[im 23/45]
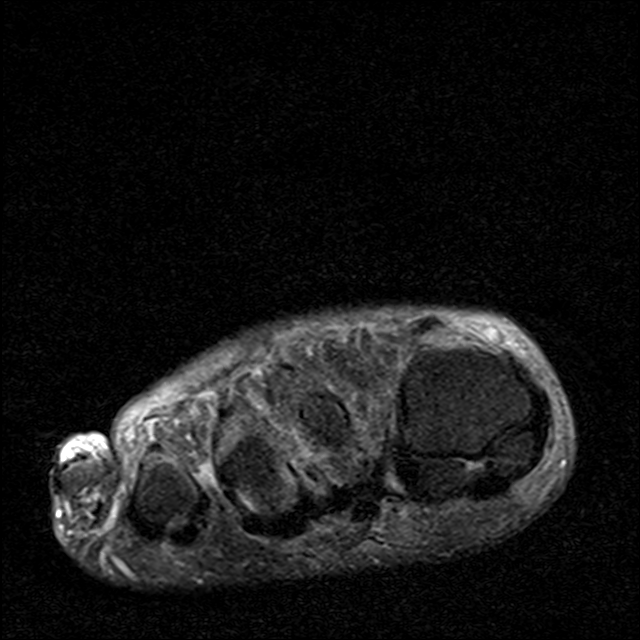
[im 39/45]
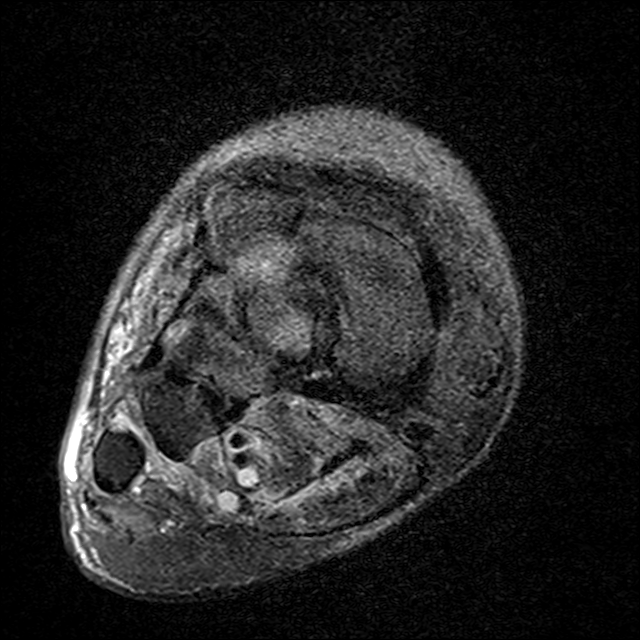

[Series 6: T1 · coronal · 3.0mm · 0.19mm/px · 3 of 45 slices shown (2 of 2)]
[im 7/45]
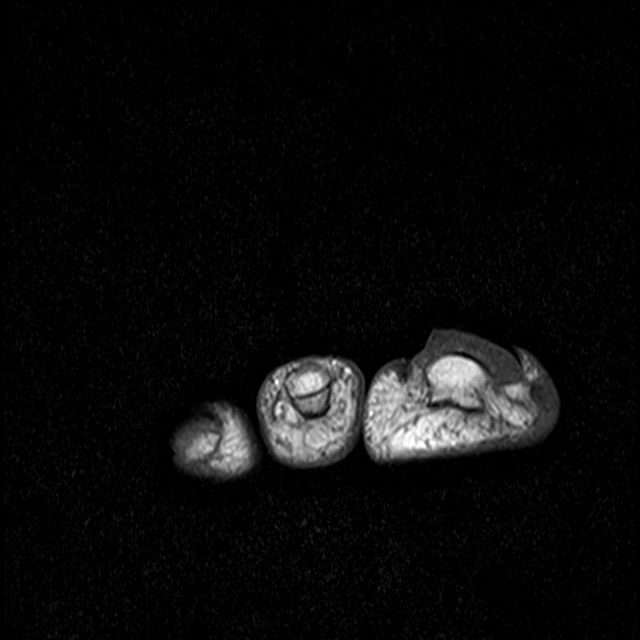
[im 26/45]
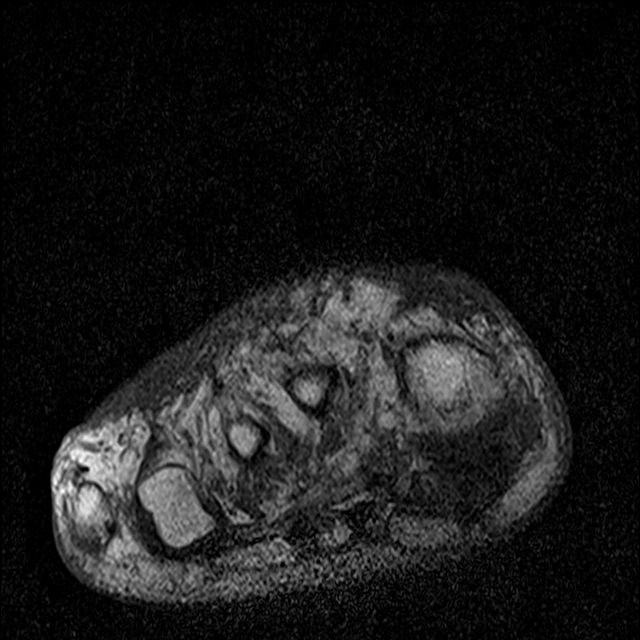
[im 38/45]
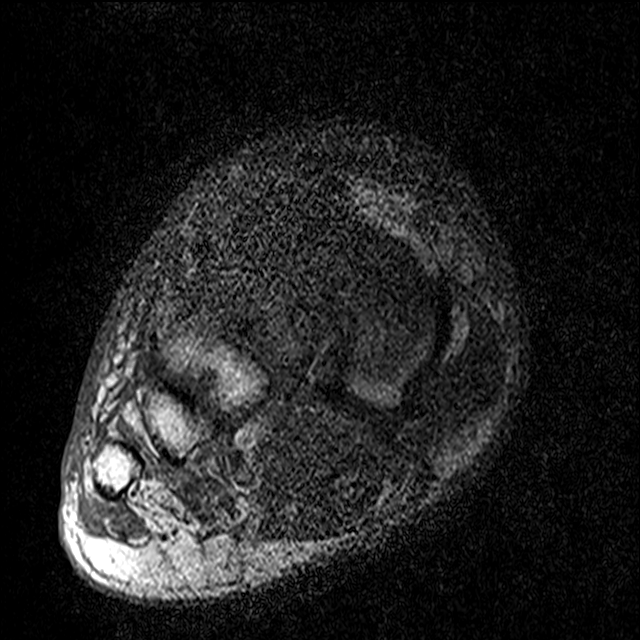

[Series 7: T2 fat-sat · axial · 3.0mm · 0.35mm/px · z∈[-93,-9]mm · 3 of 24 slices shown (2 of 2)]
[im 1/24]
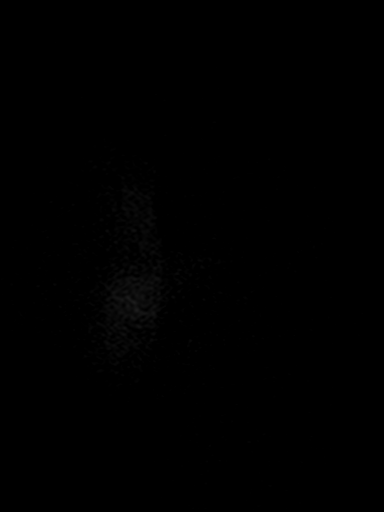
[im 16/24]
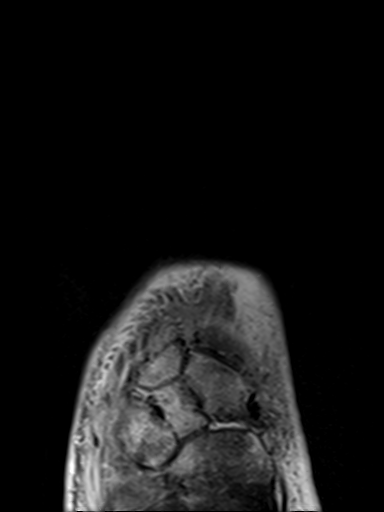
[im 24/24]
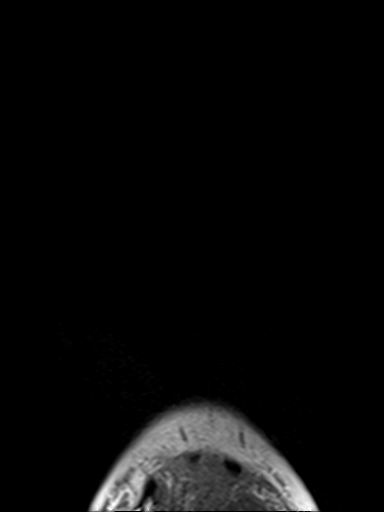

[14 of 40 positions shown; findings below may reference images not displayed]

FINDINGS: Despite efforts by the technologist and patient, motion artifact is
present on today's exam and could not be eliminated. This reduces
exam sensitivity and specificity.

Bones/Joints/Cartilage: There is high-grade marrow edema within the
lateral greater than medial aspect of the navicular, and throughout
the medial, intermediate, and lateral cuneiforms as well as the
partially visualized base of the second greater than third
metatarsals. There is moderate to high-grade joint space narrowing
of the navicular-cuneiform and tarsometatarsal joints. As seen on
prior lateral radiographs, there is dorsal extension of bone in the
region of the intermediate cuneiform that appears to represent a
high-grade dorsal subluxation. There is curvilinear decreased T1 and
decreased T2 signal within the mid transverse dimension of the
navicular that may represent an acute to subacute fracture line
(sagittal series 8, image 17). Additional possible subacute
nondisplaced fracture line within the medial cuneiform (sagittal
series 8 image 20) with high-grade surrounding marrow edema.

There is linear a decreased T2 signal within the proximal shaft of
the second metatarsal (sagittal series 8, image 15) with mild
cortical step-off at the junction of the base and shaft of the
metatarsal, indicating an acute to subacute nondisplaced fracture.

Moderate to severe navicular-medial cuneiform cartilage loss.

Ligaments

The Lisfranc ligament complex appears intact.

Muscles and Tendons

There is moderate edema throughout the plantar midfoot musculature,
nonspecific myositis.

Soft tissues

Mild-to-moderate edema and swelling of the subcutaneous fat of the
dorsal ankle and midfoot diffusely.
IMPRESSION: IMPRESSION
High-grade marrow edema throughout the navicular and cuneiform
tarsals as well as the base of the second greater than third
metatarsals with high-grade degenerative changes, dorsal subluxation
of the intermediate cuneiform, and likely navicular, medial
cuneiform, and proximal shaft of the second metatarsal nondisplaced
acute to subacute nondisplaced fractures. Findings are consistent
with Charcot arthropathy.

## 2021-09-29 IMAGING — MR MR ANKLE*R* WO/W CM
6 series · 38 of 40 positions shown · IV contrast (multihance)
Comparison: Right ankle and foot radiographs [DATE]

CLINICAL DATA: Chronic ankle pain. Diabetes. Osteoarthritis
suspected. Evaluate Charcot arthropathy. Trauma. Surgical
consideration.

EXAM:
MRI OF THE RIGHT ANKLE WITHOUT AND WITH CONTRAST
TECHNIQUE: Multiplanar, multisequence MR imaging of the ankle was performed
before and after the administration of intravenous contrast.
CONTRAST:  20mL MULTIHANCE GADOBENATE DIMEGLUMINE 529 MG/ML IV SOLN

[Series 3: T2 fat-sat · axial · 3.0mm · 0.50mm/px · z∈[-25,+93]mm · 7 of 32 slices shown (1 of 2)]
[im 1/32]
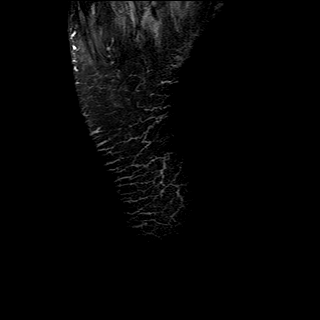
[im 6/32]
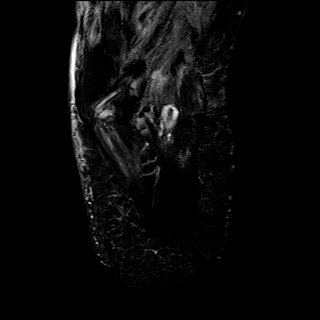
[im 11/32]
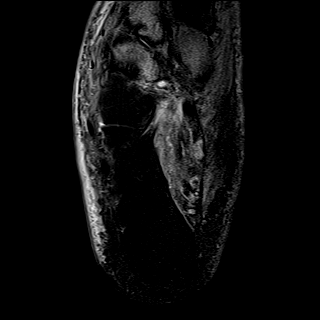
[im 16/32]
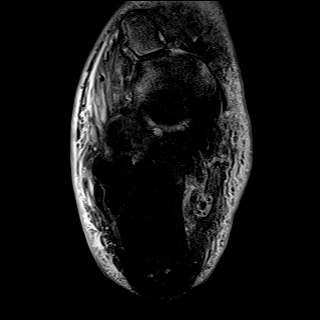
[im 21/32]
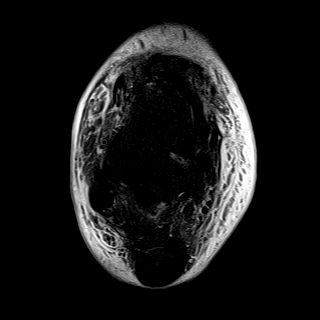
[im 26/32]
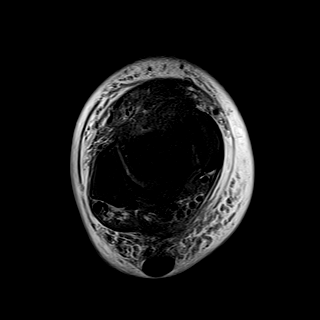
[im 32/32]
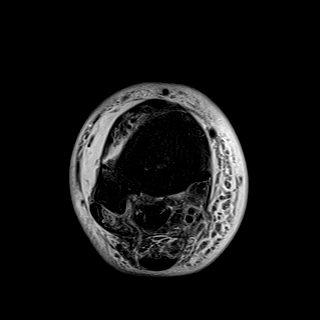

[Series 4: PD fat-sat · axial · 3.0mm · 0.50mm/px · z∈[-29,+89]mm · 8 of 32 slices shown]
[im 1/32]
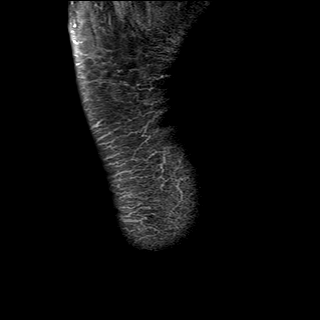
[im 5/32]
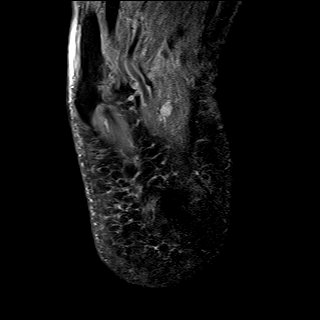
[im 9/32]
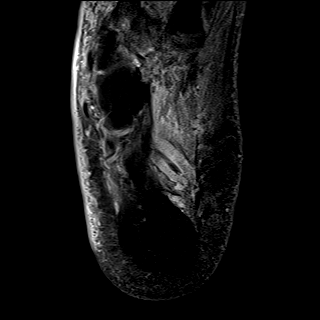
[im 14/32]
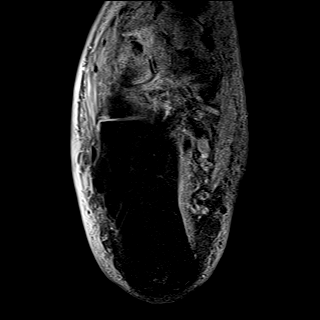
[im 18/32]
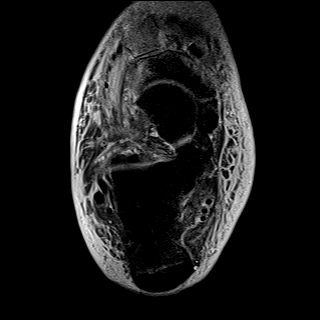
[im 23/32]
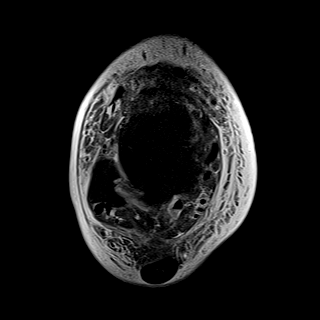
[im 27/32]
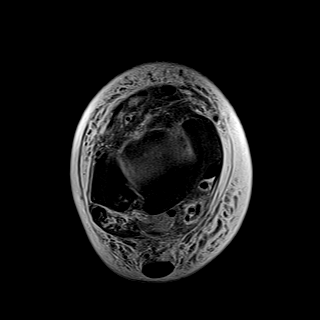
[im 32/32]
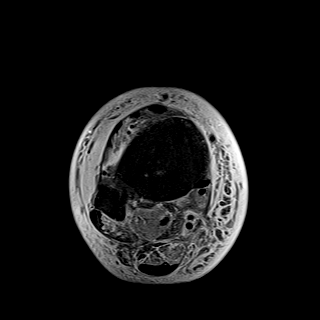

[Series 5: T1 · sagittal · 4.0mm · 0.56mm/px · 6 of 24 slices shown (1 of 2)]
[im 1/24]
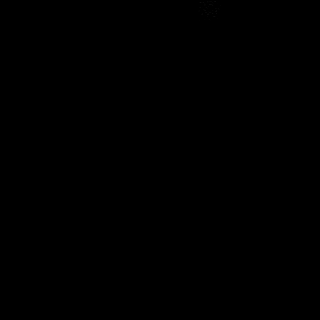
[im 5/24]
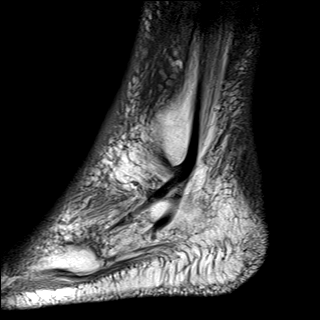
[im 10/24]
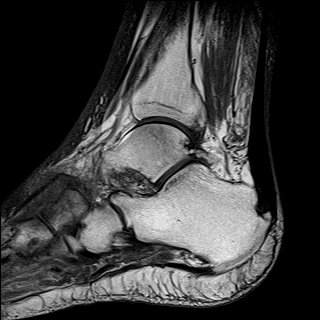
[im 14/24]
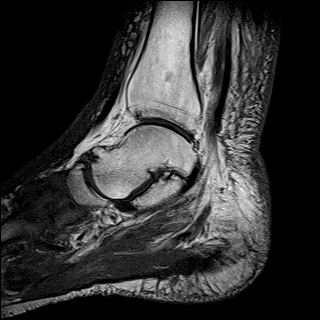
[im 19/24]
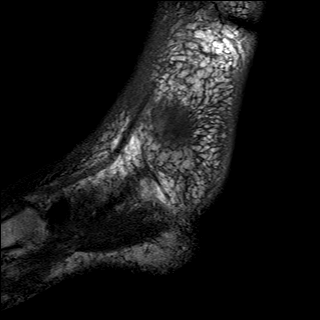
[im 24/24]
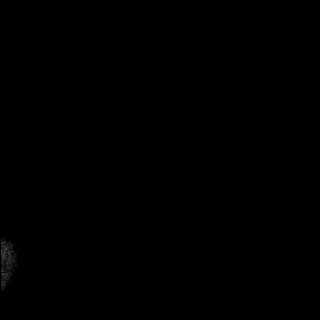

[Series 6: STIR · sagittal · 4.0mm · 0.35mm/px · 4 of 24 slices shown]
[im 1/24]
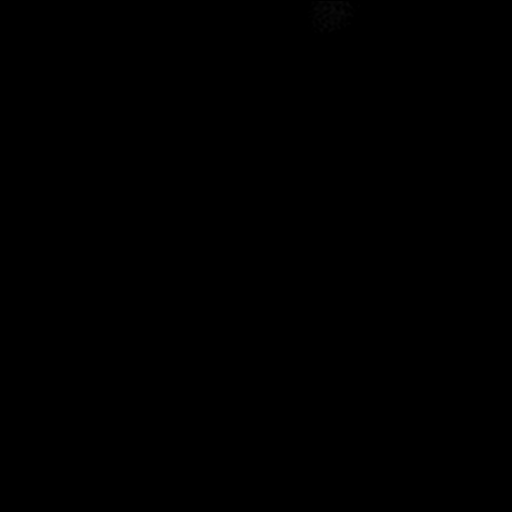
[im 5/24]
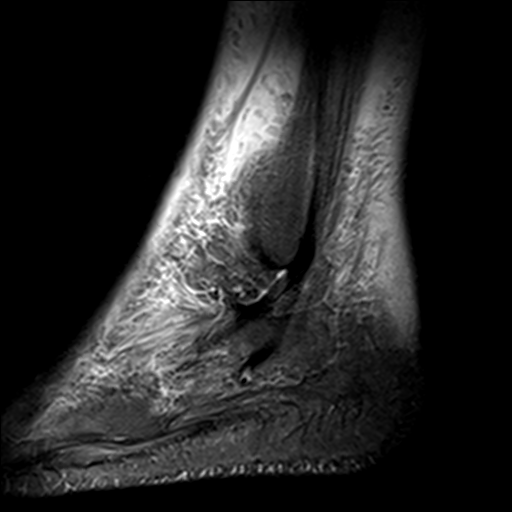
[im 10/24]
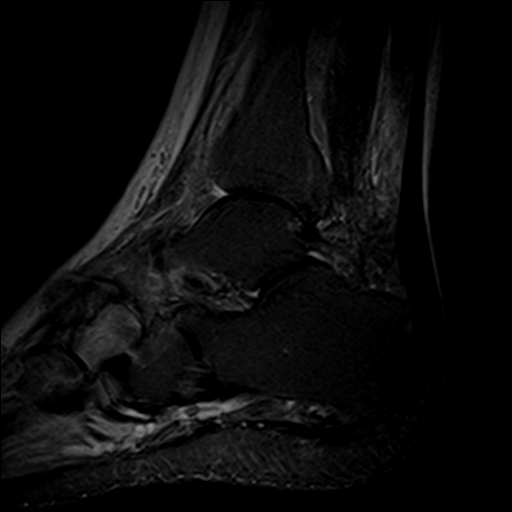
[im 14/24]
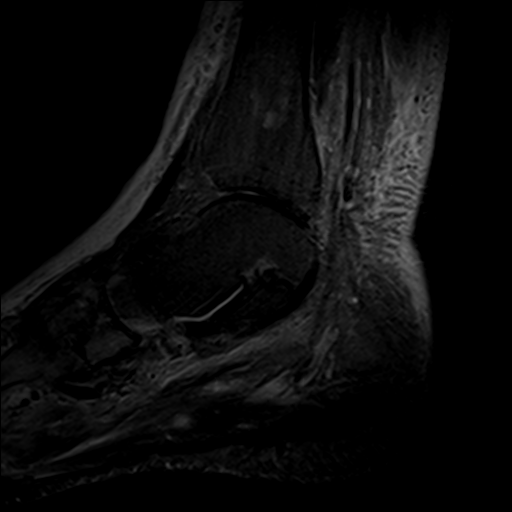

[Series 7: T2 fat-sat · coronal · 3.0mm · 0.50mm/px · 8 of 35 slices shown (2 of 2)]
[im 1/35]
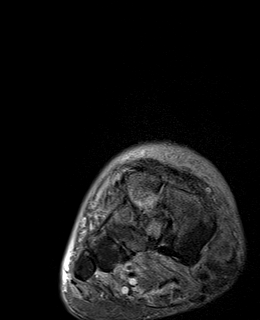
[im 5/35]
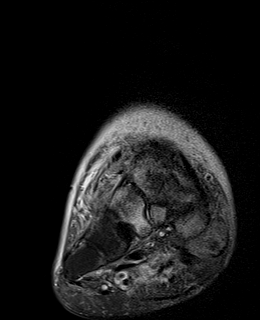
[im 10/35]
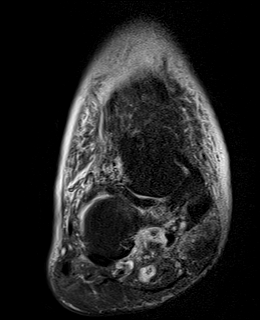
[im 15/35]
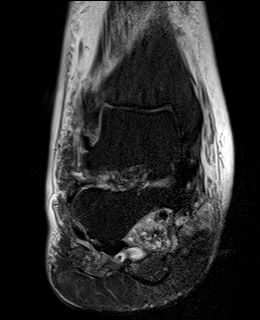
[im 20/35]
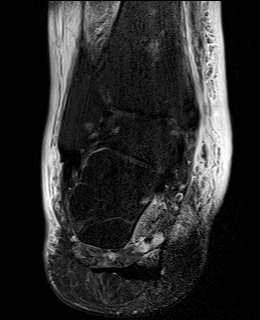
[im 25/35]
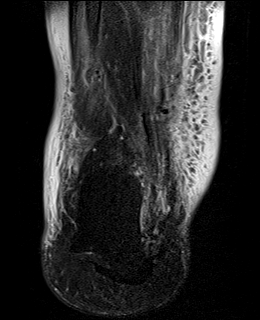
[im 30/35]
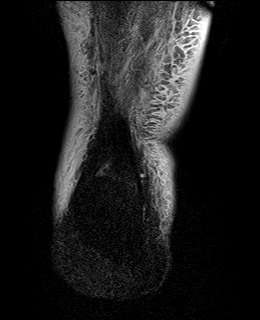
[im 35/35]
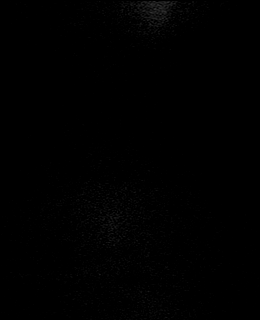

[Series 8: T1 · axial · 3.0mm · 0.50mm/px · z∈[-25,+81]mm · 5 of 20 slices shown (2 of 2)]
[im 1/20]
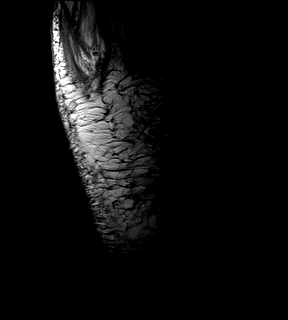
[im 5/20]
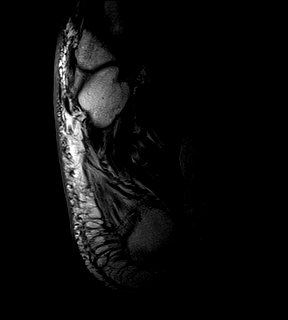
[im 10/20]
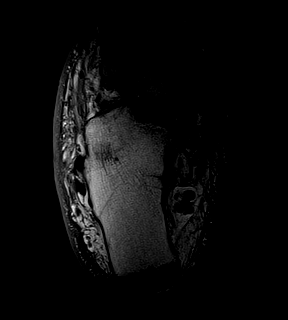
[im 15/20]
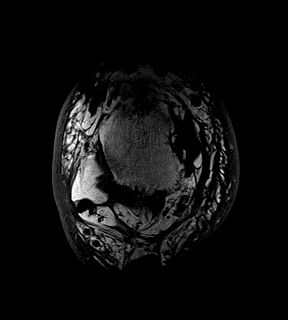
[im 20/20]
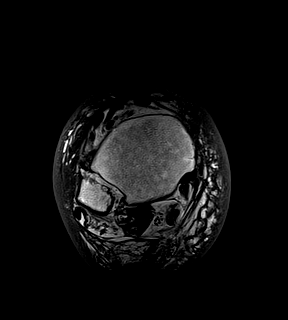

[38 of 40 positions shown; findings below may reference images not displayed]

FINDINGS: TENDONS

Peroneal: The peroneus longus and brevis tendons are intact.

Posteromedial: Intact tibialis posterior, flexor digitorum longus,
and flexor hallucis longus tendons.

Anterior: The tibialis anterior, extensor hallucis longus, and
extensor digitorum longus tendons are intact.

Achilles: Moderate chronic thickening of the distal Achilles tendon
with normal low signal suggesting the sequela of remote/chronic
Achilles tendinosis. There is mild chronic spurring at the
posterosuperior aspect of the calcaneus at the Achilles tendon
insertion.

Plantar Fascia: Small plantar calcaneal heel spur. No plantar
fasciitis. Mild chronic thickening of the origin of the medial band
of the plantar fascia.

LIGAMENTS

Lateral: The anterior and posterior talofibular, anterior and
posterior tibiofibular, and calcaneofibular ligaments are intact.

Medial: The tibiotalar deep deltoid and tibial spring ligaments are
intact.

CARTILAGE

Ankle Joint: Moderate thinning of the tibiotalar cartilage.

Subtalar Joints/Sinus Tarsi: Fat is preserved within sinus tarsi.

Bones: There is high-grade marrow edema within the lateral greater
than medial aspect of the navicular, and throughout the medial
intermediate and lateral cuneiforms as well as the partially
visualized base of the second greater than third metatarsals. There
is moderate to high-grade joint space narrowing of the
navicular-cuneiform and tarsometatarsal joints. As seen on prior
lateral radiographs, there is dorsal extension of bone in the region
of the intermediate cuneiform that appears to represent a high-grade
dorsal subluxation. There is curvilinear decreased T1 and decreased
T2 signal within the mid transverse dimension of the navicular that
may represent an acute to subacute fracture line (sagittal series 6,
image 11). Additional subtle possible subacute nondisplaced fracture
line within the medial cuneiform (sagittal image 8) with high-grade
surrounding marrow edema.

Other: The tarsal tunnel is unremarkable. The Lisfranc ligament
complex is intact.
IMPRESSION: IMPRESSION
High-grade marrow edema throughout the navicular and cuneiform
tarsals as well as the base of the second greater than third
metatarsals with high-grade degenerative changes, dorsal subluxation
of the intermediate cuneiform, and likely navicular and medial
cuneiform nondisplaced acute to subacute fractures. Findings are
consistent with Charcot arthropathy.

## 2021-09-29 MED ORDER — GADOBENATE DIMEGLUMINE 529 MG/ML IV SOLN
20.0000 mL | Freq: Once | INTRAVENOUS | Status: AC | PRN
Start: 2021-09-29 — End: 2021-09-29
  Administered 2021-09-29: 20 mL via INTRAVENOUS

## 2021-09-30 ENCOUNTER — Telehealth: Payer: Self-pay | Admitting: *Deleted

## 2021-09-30 ENCOUNTER — Ambulatory Visit: Payer: Medicare HMO | Admitting: Podiatry

## 2021-09-30 DIAGNOSIS — E13610 Other specified diabetes mellitus with diabetic neuropathic arthropathy: Secondary | ICD-10-CM | POA: Diagnosis not present

## 2021-09-30 NOTE — Telephone Encounter (Signed)
-----   Message from Elinor Parkinson, [place] sent at 09/30/2021  7:05 AM EDT ----- ?I think I already sent this to you.  If you remember he was the gentleman that had to go for an endurance test because he is on the transplant list.  Unfortunately he has Charcot arthropathy and is in early stages of breakdown he needs to be booted and nonweightbearing with crutches or scooter.  You can make an appointment to see me.  This will take several months to reconsolidate. ?

## 2021-09-30 NOTE — Progress Notes (Signed)
He presents today to pick up a cam walker.  States that his foot feels about the same. ? ?Objective: Vital signs stable he is alert oriented x3 still has edema to the right leg.  We discussed his MRI findings today consistent with Charcot arthropathy. ? ?Plan: At this point dispensed cam walker discussed nonweightbearing status we will follow-up with him in 1 month for rex-ray. ?

## 2021-09-30 NOTE — Telephone Encounter (Signed)
Called patient - informed him of results and treatment going forward. Patient will be coming in today at 11:30 to get fitted for a boot. Also explained he will need to be nonweightbearing for several months. ?

## 2021-11-03 ENCOUNTER — Ambulatory Visit: Payer: Medicare HMO | Admitting: Podiatry

## 2021-11-03 DIAGNOSIS — E13610 Other specified diabetes mellitus with diabetic neuropathic arthropathy: Secondary | ICD-10-CM

## 2021-11-05 ENCOUNTER — Ambulatory Visit (INDEPENDENT_AMBULATORY_CARE_PROVIDER_SITE_OTHER): Payer: Medicare HMO

## 2021-11-05 ENCOUNTER — Ambulatory Visit: Payer: Medicare HMO | Admitting: Podiatry

## 2021-11-05 DIAGNOSIS — E13610 Other specified diabetes mellitus with diabetic neuropathic arthropathy: Secondary | ICD-10-CM

## 2021-11-08 NOTE — Progress Notes (Signed)
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  Recently diagnosed by MRI with Charcot joint right lower extremity he states that he feels some better and is currently utilizing a walking stick as an assist.  Objective: Vital signs are stable he is alert and oriented x3.  Pulses remain palpable to the right lower extremity.  Much decrease in fluid to his right leg.  Much decrease in erythema.  No cellulitic process here.  The foot is normal temperature to the touch.  Some mild edema is present.  Radiographs taken today demonstrate no change in architecture however there appears to be some consolidation of the deformity that is present.  It looks like he is starting to enter the consolidation phase of the Charcot arthropathy at this point.  Not demonstrating any signs at all of infection in this foot.  No osteomyelitic process visible on radiographs.  Assessment slowly consolidating Charcot arthropathy right lower extremity.  Plan: Though this is still moderately tender I expressed to him that he needs to stay off of this is much as possible he is currently not wearing his cam boot because of the superficial ulceration that it caused on his anterior leg.  He states that this is healing well so he states he might try the boot again.  I recommended to him to try to stay off the foot is much as possible to allow for this consolidation and then we probably would not have to worry about wearing the boot which could cause ulceration and a cellulitic process of his edematous leg.  I will follow-up with him in about 6 weeks for another set of x-rays hopefully be completely consolidated at that time.

## 2021-12-12 DEATH — deceased

## 2021-12-17 ENCOUNTER — Ambulatory Visit: Payer: Medicare HMO | Admitting: Podiatry
